# Patient Record
Sex: Female | Born: 1990 | Race: White | Hispanic: No | Marital: Single | Smoking: Never smoker
Health system: Southern US, Community
[De-identification: ages and names within clinical notes are randomized; demographics above are authoritative.]

---

## 2020-02-19 ENCOUNTER — Encounter (HOSPITAL_BASED_OUTPATIENT_CLINIC_OR_DEPARTMENT_OTHER): Payer: Self-pay

## 2020-02-19 ENCOUNTER — Emergency Department (HOSPITAL_BASED_OUTPATIENT_CLINIC_OR_DEPARTMENT_OTHER): Payer: Self-pay

## 2020-02-19 ENCOUNTER — Other Ambulatory Visit: Payer: Self-pay

## 2020-02-19 ENCOUNTER — Emergency Department (HOSPITAL_BASED_OUTPATIENT_CLINIC_OR_DEPARTMENT_OTHER)
Admission: EM | Admit: 2020-02-19 | Discharge: 2020-02-19 | Disposition: A | Discharge status: Home / Self Care | Payer: Self-pay | Attending: Emergency Medicine | Admitting: Emergency Medicine

## 2020-02-19 DIAGNOSIS — X509XXA Other and unspecified overexertion or strenuous movements or postures, initial encounter: Secondary | ICD-10-CM | POA: Insufficient documentation

## 2020-02-19 DIAGNOSIS — Y9389 Activity, other specified: Secondary | ICD-10-CM | POA: Insufficient documentation

## 2020-02-19 DIAGNOSIS — M79672 Pain in left foot: Secondary | ICD-10-CM

## 2020-02-19 DIAGNOSIS — Y999 Unspecified external cause status: Secondary | ICD-10-CM | POA: Insufficient documentation

## 2020-02-19 DIAGNOSIS — M25572 Pain in left ankle and joints of left foot: Secondary | ICD-10-CM

## 2020-02-19 DIAGNOSIS — Y9289 Other specified places as the place of occurrence of the external cause: Secondary | ICD-10-CM | POA: Insufficient documentation

## 2020-02-19 DIAGNOSIS — S9002XA Contusion of left ankle, initial encounter: Secondary | ICD-10-CM | POA: Insufficient documentation

## 2020-02-19 NOTE — ED Provider Notes (Signed)
[facility] EMERGENCY DEPARTMENT Provider Note   CSN: [id number] Arrival date & time: 02/19/20  2016     History Chief Complaint  Patient presents with   Foot Injury    Deborah Acevedo is a 29 y.o. female with past medical history who presents for evaluation of left ankle and foot pain.  Patient states she was on the slide with her child when she inverted her left ankle and foot.  Was initially able to walk on it however has had increased pain.  She has not take anything for pain.  She rates her pain a 7/10.  She is not on anything for pain at this time.  She is noted some swelling some ecchymosis to her lateral malleolus and dorsal aspect of her left foot.  No any headache, nausea or any coagulation.  No fever, chills, nausea, vomiting, chest pain, shortness of breath, redness, warmth, paresthesias to extremities.  States she does have some decreased range of motion to her foot and ankle secondary to pain.  Denies aggravating or relieving factors.  History obtained from patient and past medical records.  No interpreter is used.  HPI     No past medical history on file.  There are no problems to display for this patient.   History reviewed. No pertinent surgical history.   OB History   No obstetric history on file.     No family history on file.  Social History   Tobacco Use   Smoking status: Never Smoker   Smokeless tobacco: Never Used  Vaping Use   Vaping Use: Never used  Substance Use Topics   Alcohol use: Never   Drug use: Never    Home Medications Prior to Admission medications   Not on File    Allergies    Penicillin g  Review of Systems   Review of Systems  Constitutional: Negative.   HENT: Negative.   Respiratory: Negative.   Cardiovascular: Negative.   Gastrointestinal: Negative.   Genitourinary: Negative.   Musculoskeletal: Positive for gait problem.       Foot and ankle pain  Skin: Negative.   All other systems reviewed and  are negative.   Physical Exam Updated Vital Signs BP 115/85 (BP Location: Left Arm)    Pulse (!) 103    Temp 98.7 F (37.1 C) (Oral)    Resp 20    Ht 5\' 4"  (1.626 m)    Wt 90.7 kg    SpO2 100%    BMI 34.33 kg/m   Physical Exam Vitals and nursing note reviewed.  Constitutional:      General: She is not in acute distress.    Appearance: She is well-developed. She is not ill-appearing, toxic-appearing or diaphoretic.  HENT:     Head: Normocephalic and atraumatic.     Mouth/Throat:     Mouth: Mucous membranes are moist.  Eyes:     Pupils: Pupils are equal, round, and reactive to light.  Cardiovascular:     Rate and Rhythm: Normal rate.     Pulses: Normal pulses.     Heart sounds: Normal heart sounds.  Pulmonary:     Effort: Pulmonary effort is normal. No respiratory distress.     Breath sounds: Normal breath sounds.  Abdominal:     General: Bowel sounds are normal. There is no distension.  Musculoskeletal:        General: Normal range of motion.     Cervical back: Normal range of motion.  Comments: Tenderness palpation to lateral medial malleolus to left foot as well as over ATFL and to dorsal aspect of foot.  Wiggles toes without difficulty.  Is able to minimally plantarflex and dorsiflex however has pain.  No bony tenderness to midshaft, proximal tibia or fibula.  No tenderness over calcaneus.  Skin:    General: Skin is warm and dry.     Capillary Refill: Capillary refill takes less than 2 seconds.     Comments: Diffuse ecchymosis to left lateral malleolus, ATFL dorsal aspect of foot.  No erythema or warmth.  No fluctuance induration.  Neurological:     Mental Status: She is alert.     Sensory: Sensation is intact.     Motor: Motor function is intact.     Comments: Ambulatory with limp.  Decreased strength with plantar flexion to left foot secondary to pain.  Intact sensation.    ED Results / Procedures / Treatments   Labs (all labs ordered are listed, but only abnormal  results are displayed) Labs Reviewed - No data to display  EKG None  Radiology DG Ankle Complete Left  Result Date: 02/19/2020 CLINICAL DATA:  Status post trauma. EXAM: LEFT ANKLE COMPLETE - 3+ VIEW COMPARISON:  None. FINDINGS: There is no evidence of fracture, dislocation, or joint effusion. There is no evidence of arthropathy or other focal bone abnormality. Soft tissues are unremarkable. IMPRESSION: Negative. Electronically Signed   By: Virgina Norfolk M.D.   On: 02/19/2020 20:48   DG Foot Complete Left  Result Date: 02/19/2020 CLINICAL DATA:  Status post trauma. EXAM: LEFT FOOT - COMPLETE 3+ VIEW COMPARISON:  None. FINDINGS: There is no evidence of fracture or dislocation. There is no evidence of arthropathy or other focal bone abnormality. Mild-to-moderate severity focal soft tissue swelling is seen along the lateral aspect of the mid left foot. This is along the proximal portion of the fifth left metatarsal. Mild dorsal soft tissue swelling is also seen. IMPRESSION: Mild to moderate severity focal soft tissue swelling without evidence of an acute fracture. Electronically Signed   By: Virgina Norfolk M.D.   On: 02/19/2020 20:48    Procedures .Splint Application  Date/Time: 02/19/2020 9:44 PM Performed by: Nettie Elm, PA-C Authorized by: Nettie Elm, PA-C   Consent:    Consent obtained:  Verbal   Consent given by:  Patient   Risks discussed:  Discoloration, numbness, pain and swelling   Alternatives discussed:  Referral, observation, alternative treatment, delayed treatment and no treatment Pre-procedure details:    Sensation:  Normal Procedure details:    Laterality:  Left   Location:  Ankle   Ankle:  L ankle   Strapping: no     Cast type:  Short leg   Splint type:  Short leg Post-procedure details:    Pain:  Improved   Sensation:  Normal   Patient tolerance of procedure:  Tolerated well, no immediate complications   (including critical care  time)  Medications Ordered in ED Medications - No data to display  ED Course  I have reviewed the triage vital signs and the nursing notes.  Pertinent labs & imaging results that were available during my care of the patient were reviewed by me and considered in my medical decision making (see chart for details).  29 year old female presents for evaluation of left ankle and foot pain after inverting this at playground with child.  Afebrile, nonseptic, non-ill-appearing.  Patient with diffuse soft tissue swelling to left lateral malleolus as well  as over ATFL and dorsal aspect of foot.  No erythema or warmth.  Is able to flex and extend however has pain.  Ambulatory however with pain.  Neurovascularly intact.  Compartments soft.  Plain film x-ray does not show evidence of fracture or dislocation.  Suspect sprain or strain.  Will place in splint, crutches.  Low suspicion for septic joint, gout, hemarthrosis, myositis, vascular occlusion, bacterial infectious process.  RICE for symptomatic management.  Patient may follow-up outpatient with orthopedics if symptoms do not resolve. She declined pain medication here in ED.  The patient has been appropriately medically screened and/or stabilized in the ED. I have low suspicion for any other emergent medical condition which would require further screening, evaluation or treatment in the ED or require inpatient management.  Patient is hemodynamically stable and in no acute distress.  Patient able to ambulate in department prior to ED.  Evaluation does not show acute pathology that would require ongoing or additional emergent interventions while in the emergency department or further inpatient treatment.  I have discussed the diagnosis with the patient and answered all questions.  Pain is been managed while in the emergency department and patient has no further complaints prior to discharge.  Patient is comfortable with plan discussed in room and is stable for  discharge at this time.  I have discussed strict return precautions for returning to the emergency department.  Patient was encouraged to follow-up with PCP/specialist refer to at discharge.    MDM Rules/Calculators/A&P                           Final Clinical Impression(s) / ED Diagnoses Final diagnoses:  Acute left ankle pain  Left foot pain    Rx / DC Orders ED Discharge Orders    None       Michaell Grider A, PA-C 02/19/20 2147    Pricilla Loveless, MD 02/22/20 1542

## 2020-02-19 NOTE — Discharge Instructions (Signed)
Sure to ice and elevate your foot and ankle.  Use the crutches over the next 3 days.  If you are not able to ambulate after 3 days follow-up with orthopedics.  You may take Tylenol ibuprofen as needed for pain.  Do not take ibuprofen if you think you may be pregnant.

## 2020-02-19 NOTE — ED Triage Notes (Signed)
Pt presents with L ankle and foot pain after injuring it on a playground slide.

## 2020-11-04 IMAGING — CR DG ANKLE COMPLETE 3+V*L*
3 series · 3 of 3 positions shown · non-contrast
Comparison: None.

CLINICAL DATA: Status post trauma.

EXAM:
LEFT ANKLE COMPLETE - 3+ VIEW

[t ankle joint lat left]
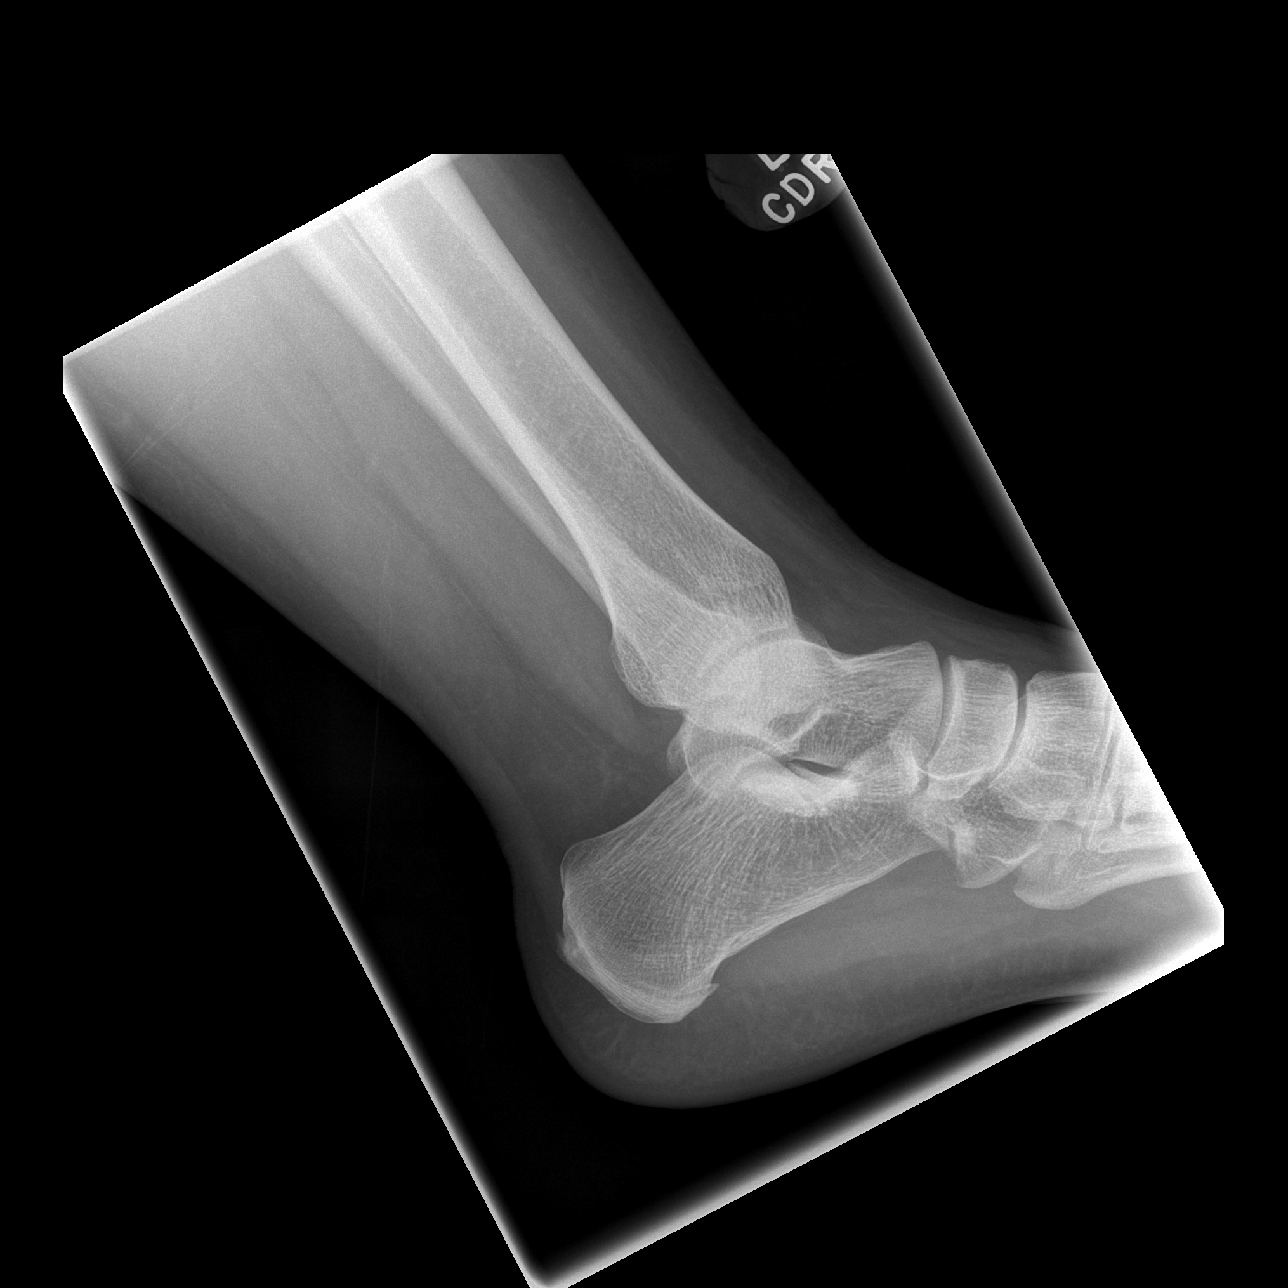

[t ankle joint ap left]
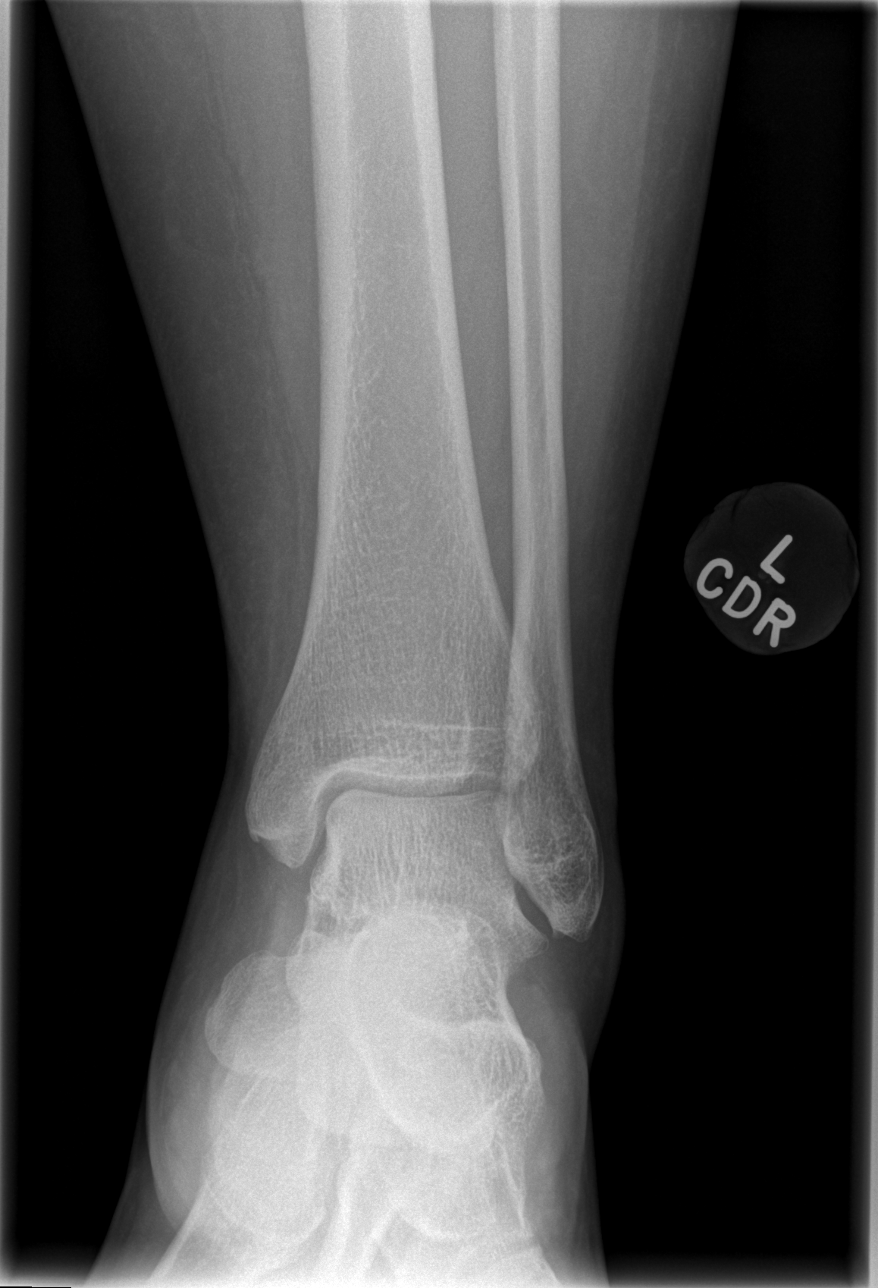

[t ankle joint oblique left]
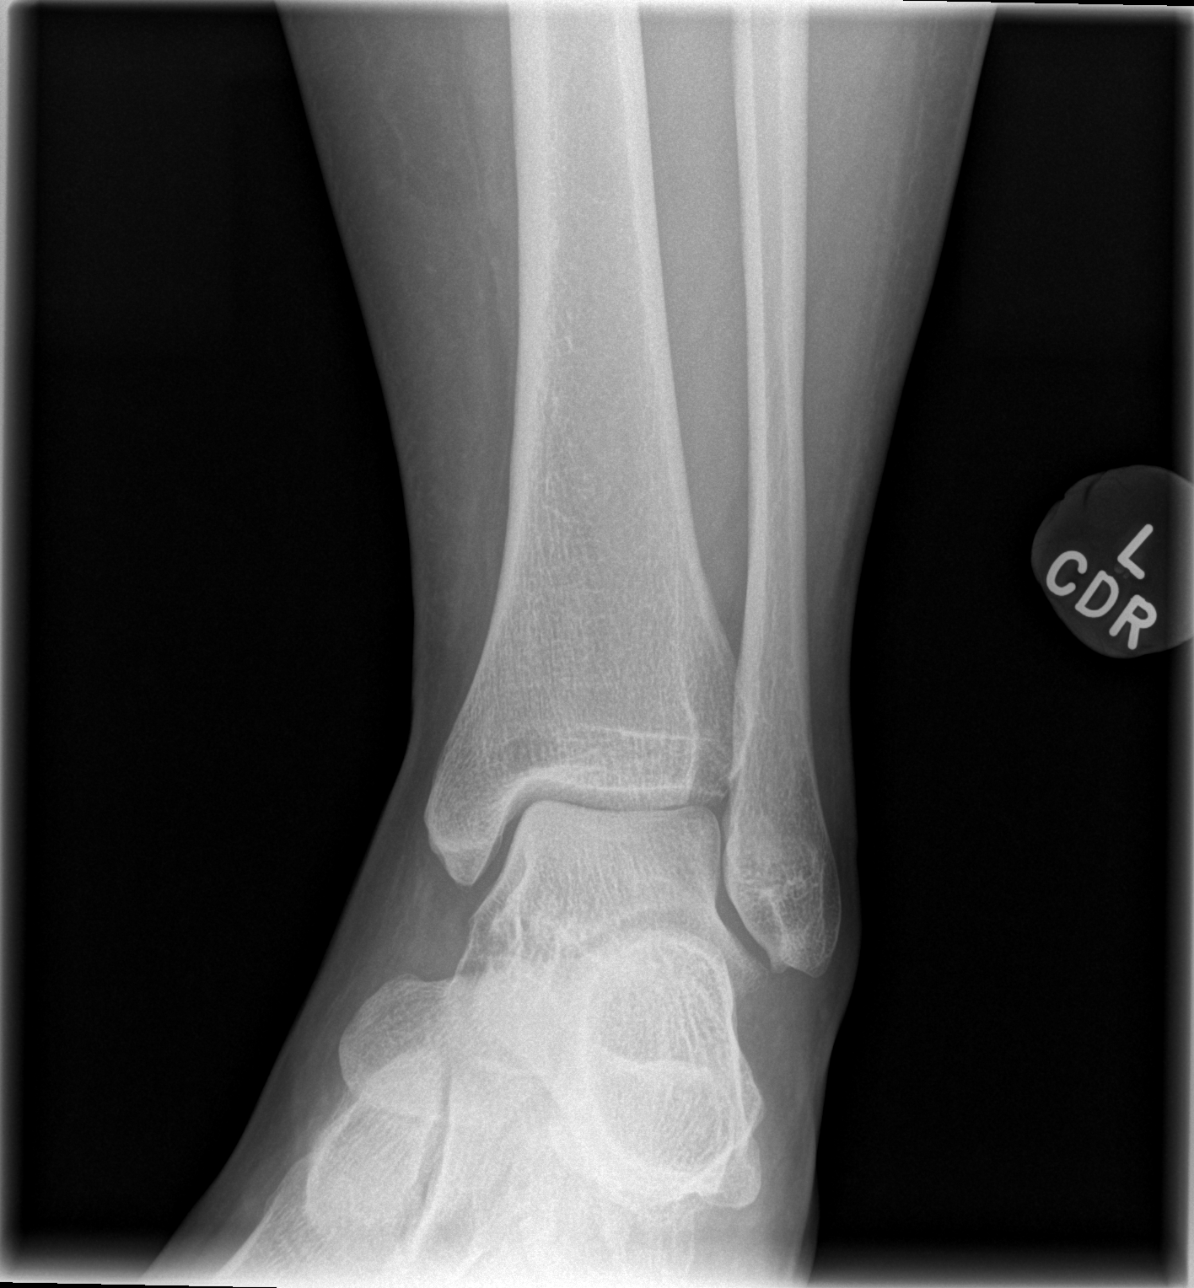

[3 of 3 positions shown; findings below may reference images not displayed]

FINDINGS: There is no evidence of fracture, dislocation, or joint effusion.
There is no evidence of arthropathy or other focal bone abnormality.
Soft tissues are unremarkable.
IMPRESSION: Negative.

## 2020-11-04 IMAGING — CR DG FOOT COMPLETE 3+V*L*
3 series · 3 of 3 positions shown · non-contrast
Comparison: None.

CLINICAL DATA: Status post trauma.

EXAM:
LEFT FOOT - COMPLETE 3+ VIEW

[t foot ap left]
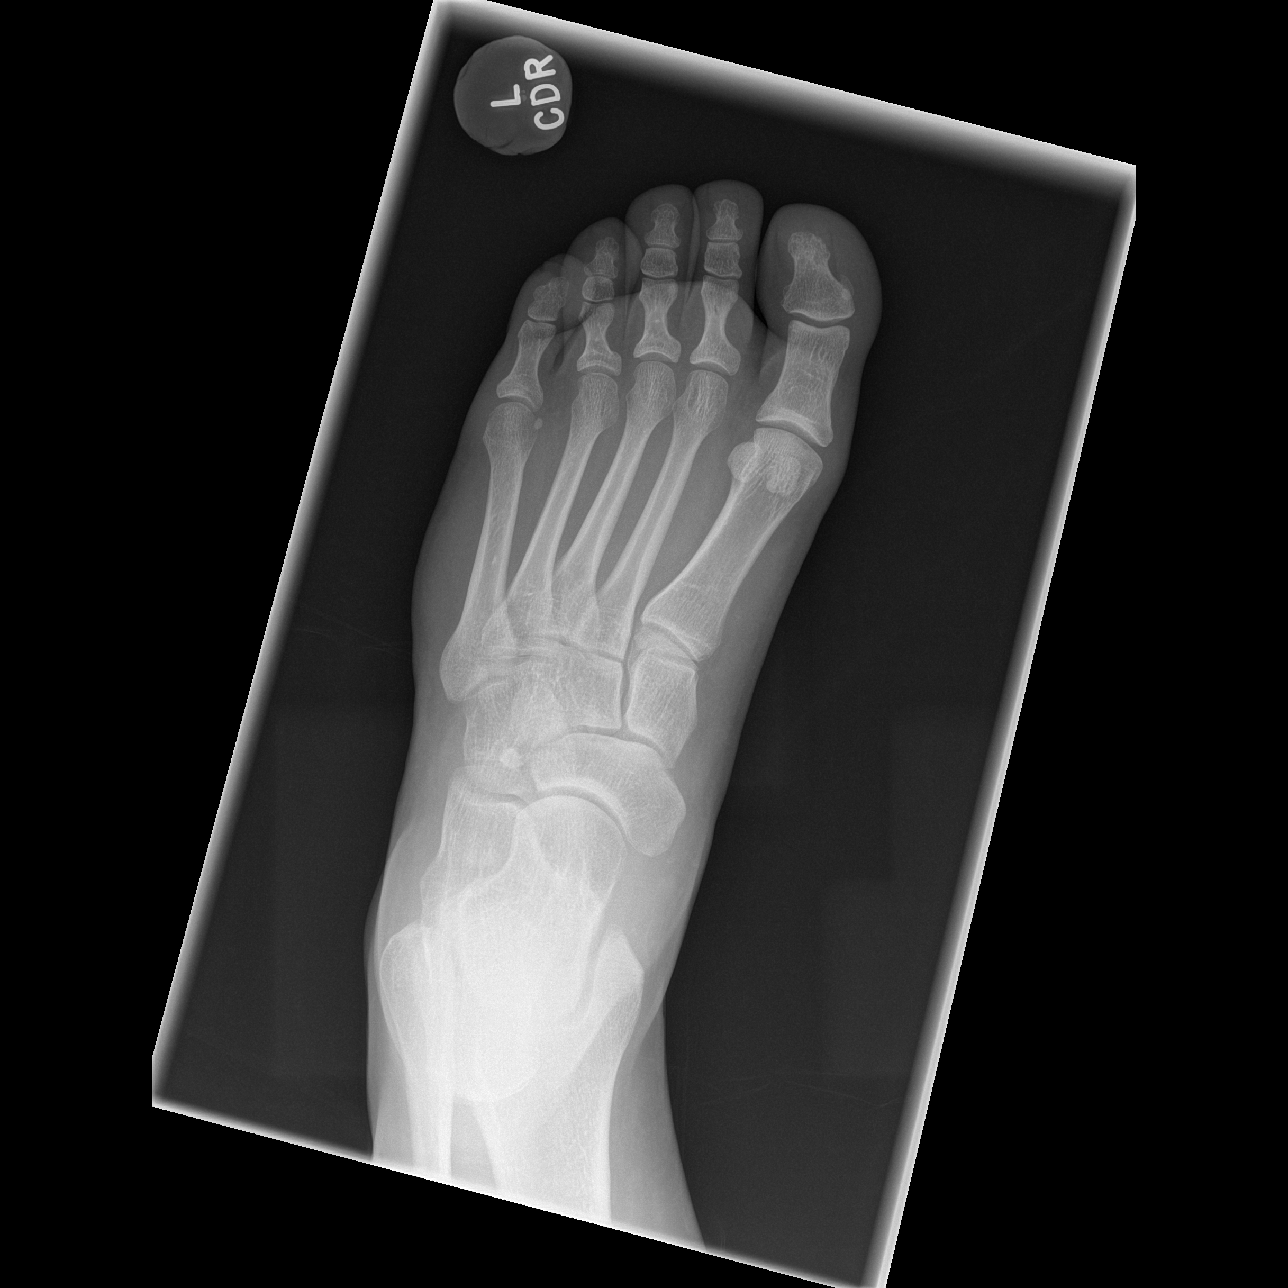

[t foot oblique left]
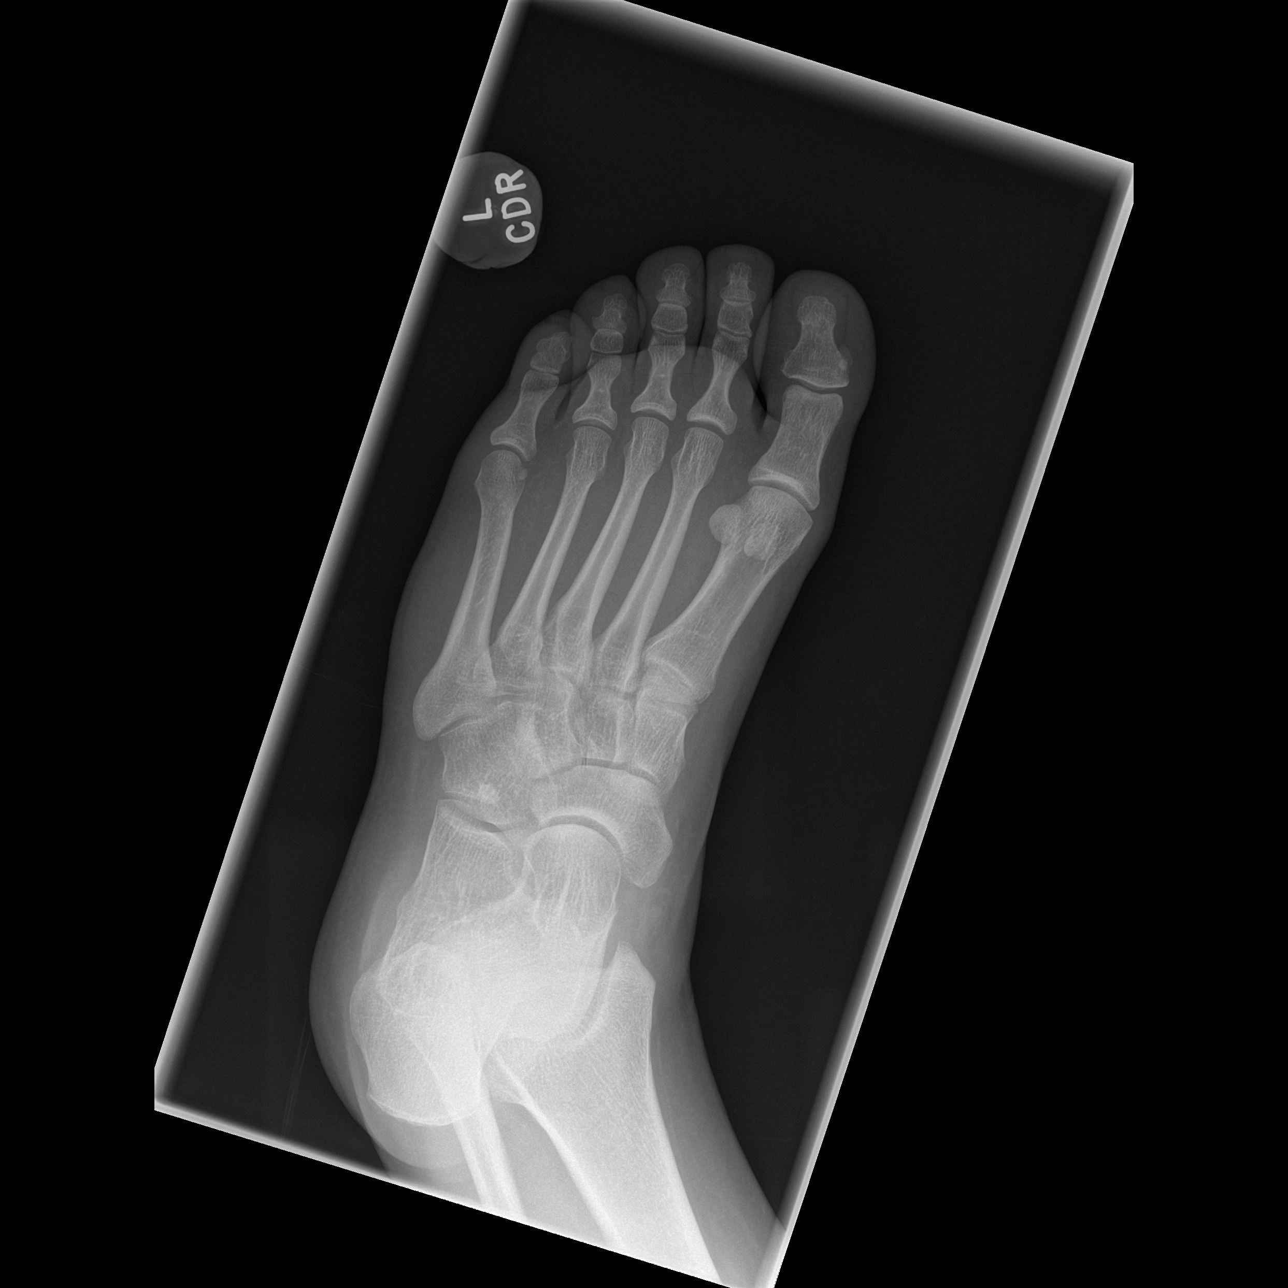

[t foot lat left]
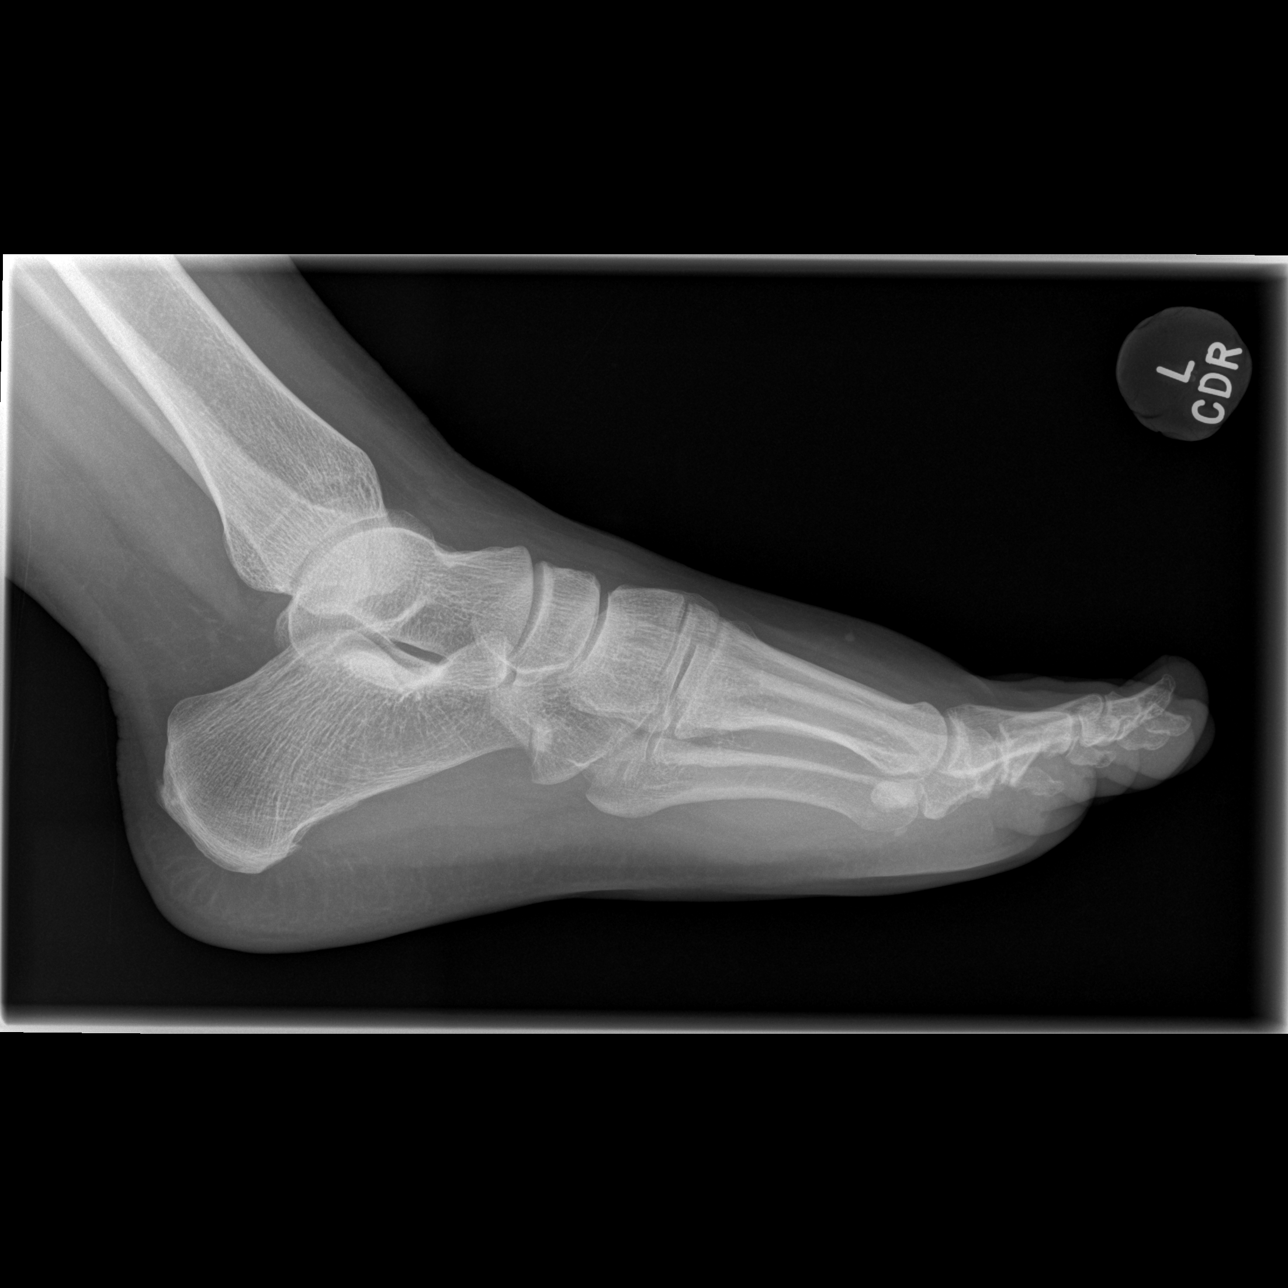

[3 of 3 positions shown; findings below may reference images not displayed]

FINDINGS: There is no evidence of fracture or dislocation. There is no
evidence of arthropathy or other focal bone abnormality.
Mild-to-moderate severity focal soft tissue swelling is seen along
the lateral aspect of the mid left foot. This is along the proximal
portion of the fifth left metatarsal. Mild dorsal soft tissue
swelling is also seen.
IMPRESSION: Mild to moderate severity focal soft tissue swelling without
evidence of an acute fracture.

## 2022-08-27 ENCOUNTER — Emergency Department (HOSPITAL_BASED_OUTPATIENT_CLINIC_OR_DEPARTMENT_OTHER): Payer: BC Managed Care – PPO

## 2022-08-27 ENCOUNTER — Emergency Department (HOSPITAL_BASED_OUTPATIENT_CLINIC_OR_DEPARTMENT_OTHER)
Admission: EM | Admit: 2022-08-27 | Discharge: 2022-08-27 | Disposition: A | Discharge status: Home / Self Care | Payer: BC Managed Care – PPO | Attending: Emergency Medicine | Admitting: Emergency Medicine

## 2022-08-27 ENCOUNTER — Other Ambulatory Visit: Payer: Self-pay

## 2022-08-27 DIAGNOSIS — Z20822 Contact with and (suspected) exposure to covid-19: Secondary | ICD-10-CM | POA: Insufficient documentation

## 2022-08-27 DIAGNOSIS — J189 Pneumonia, unspecified organism: Secondary | ICD-10-CM | POA: Insufficient documentation

## 2022-08-27 DIAGNOSIS — J101 Influenza due to other identified influenza virus with other respiratory manifestations: Secondary | ICD-10-CM | POA: Insufficient documentation

## 2022-08-27 DIAGNOSIS — J069 Acute upper respiratory infection, unspecified: Secondary | ICD-10-CM

## 2022-08-27 DIAGNOSIS — R0602 Shortness of breath: Secondary | ICD-10-CM | POA: Diagnosis present

## 2022-08-27 DIAGNOSIS — J45909 Unspecified asthma, uncomplicated: Secondary | ICD-10-CM | POA: Insufficient documentation

## 2022-08-27 MED ORDER — AZITHROMYCIN 250 MG PO TABS
500.0000 mg | ORAL_TABLET | Freq: Once | ORAL | Status: AC
  Administered 2022-08-27: 500 mg via ORAL
  Filled 2022-08-27: qty 2

## 2022-08-27 MED ORDER — AZITHROMYCIN 250 MG PO TABS: 250.0000 mg | ORAL_TABLET | Freq: Every day | ORAL | 0 refills | Status: AC

## 2022-08-27 MED ORDER — DEXAMETHASONE SODIUM PHOSPHATE 10 MG/ML IJ SOLN
10.0000 mg | Freq: Once | INTRAMUSCULAR | Status: AC
  Administered 2022-08-27: 10 mg via INTRAMUSCULAR
  Filled 2022-08-27: qty 1

## 2022-08-27 MED ORDER — ACETAMINOPHEN 325 MG PO TABS
650.0000 mg | ORAL_TABLET | Freq: Once | ORAL | Status: AC | PRN
  Administered 2022-08-27: 650 mg via ORAL
  Filled 2022-08-27: qty 2

## 2022-08-27 MED ORDER — ALBUTEROL SULFATE HFA 108 (90 BASE) MCG/ACT IN AERS
2.0000 | INHALATION_SPRAY | Freq: Once | RESPIRATORY_TRACT | Status: AC
  Administered 2022-08-27: 2 via RESPIRATORY_TRACT
  Filled 2022-08-27: qty 6.7

## 2022-08-27 NOTE — ED Notes (Signed)
Pt ambulatory from triage 

## 2022-08-27 NOTE — ED Triage Notes (Signed)
Pt tested positive for RSV and Flu on 08/08/22 and completed a round of antibiotics. Pt states cough, fever, and SOB worse. Chest pain with coughing.

## 2022-08-27 NOTE — ED Provider Notes (Signed)
Emergency Department Provider Note   I have reviewed the triage vital signs and the nursing notes.   HISTORY  Chief Complaint Fever, Cough, and Shortness of Breath   HPI Deborah Acevedo is a 31 y.o. female who presents the emergency department for relation of continued cough, fever, shortness of breath.  She tested positive for RSV and flu earlier in the month.  She completed 10 days of doxycycline and was feeling much better at that time.  Dates in the last several days her cough, fever, shortness of breath have worsened.  She does have pain in her chest but only with deep coughing.  She notes that she does have a history of asthma and often gets bronchitis which seems to respond best to azithromycin, which she has not taken for this episode.  She also describes frequent findings of possible pneumonia on her chest x-ray but has been told in the past that scarring.   No past medical history on file.  Review of Systems  Constitutional: Positive fever/chills Eyes: No visual changes. ENT: Mild sore throat and congestion.  Cardiovascular: Denies chest pain.  Respiratory: Mild shortness of breath. Positive cough.  Gastrointestinal: No abdominal pain.  No nausea, no vomiting.  No diarrhea.  No constipation. Genitourinary: Negative for dysuria. Musculoskeletal: Negative for back pain. Skin: Negative for rash. Neurological: Negative for headaches.  ____________________________________________   PHYSICAL EXAM:  VITAL SIGNS: ED Triage Vitals  Enc Vitals Group     BP 08/27/22 1847 (!) 129/95     Pulse Rate 08/27/22 1847 (!) 128     Resp 08/27/22 1847 20     Temp 08/27/22 1847 (!) 101 F (38.3 C)     Temp Source 08/27/22 2240 Oral     SpO2 08/27/22 1847 97 %     Weight 08/27/22 1851 278 lb (126.1 kg)   Constitutional: Alert and oriented. Well appearing and in no acute distress. Eyes: Conjunctivae are normal.  Head: Atraumatic. Nose: No congestion/rhinnorhea. Mouth/Throat:  Mucous membranes are moist. Neck: No stridor.   Cardiovascular: Tachycardia. Good peripheral circulation. Grossly normal heart sounds.   Respiratory: Normal respiratory effort.  No retractions. Lungs CTAB. No wheezing.  Gastrointestinal: Soft and nontender. No distention.  Musculoskeletal: No lower extremity tenderness nor edema. No gross deformities of extremities. Neurologic:  Normal speech and language. No gross focal neurologic deficits are appreciated.  Skin:  Skin is warm, dry and intact. No rash noted.  ____________________________________________   LABS (all labs ordered are listed, but only abnormal results are displayed)  Labs Reviewed  RESP PANEL BY RT-PCR (RSV, FLU A&B, COVID)  RVPGX2 - Abnormal; Notable for the following components:      Result Value   Influenza A by PCR POSITIVE (*)    All other components within normal limits   ____________________________________________  RADIOLOGY  DG Chest 2 View  Result Date: 08/27/2022 CLINICAL DATA:  Shortness of breath, positive for RSV EXAM: CHEST - 2 VIEW COMPARISON:  None Available. FINDINGS: Cardiac size is within normal limits. Subtle increase in markings are seen in medial right lower lung field. There are no signs of alveolar pulmonary edema. There is no pleural effusion or pneumothorax. IMPRESSION: Subtle increase in markings in the medial right lower lung field may suggest early pneumonia. There is no pleural effusion. Electronically Signed   By: Ernie Avena M.D.   On: 08/27/2022 19:12    ____________________________________________   PROCEDURES  Procedure(s) performed:   Procedures  None  ____________________________________________  INITIAL IMPRESSION / ASSESSMENT AND PLAN / ED COURSE  Pertinent labs & imaging results that were available during my care of the patient were reviewed by me and considered in my medical decision making (see chart for details).   This patient is Presenting for  Evaluation of URI, which does require a range of treatment options, and is a complaint that involves a high risk of morbidity and mortality.  The Differential Diagnoses include COVID, Flu, RSV, CAP, PE, ACS, etc.  Critical Interventions-    Medications  acetaminophen (TYLENOL) tablet 650 mg (650 mg Oral Given 08/27/22 1853)  albuterol (VENTOLIN HFA) 108 (90 Base) MCG/ACT inhaler 2 puff (2 puffs Inhalation Given 08/27/22 2322)  azithromycin (ZITHROMAX) tablet 500 mg (500 mg Oral Given 08/27/22 2333)  dexamethasone (DECADRON) injection 10 mg (10 mg Intramuscular Given 08/27/22 2331)    Reassessment after intervention: Symptoms improved.    I decided to review pertinent External Data, and in summary unable to find UC records or recent chest imaging for comparison.   Clinical Laboratory Tests Ordered, included flu a positive.  COVID and RSV negative.  Radiologic Tests Ordered, included CXR. I independently interpreted the images and agree with radiology interpretation.   Social Determinants of Health Risk no smoking.   Medical Decision Making: Summary:  Patient presents with fever and upper respiratory symptoms.  He testing positive for the flu here.  Chest x-ray shows questionable early pneumonia but per patient history this has also been described in prior chest x-rays as possible scarring.  I am unable to verify this to review prior chest x-rays so do plan to start azithromycin and give Decadron in the setting of known asthma as patient states this typically helps her symptoms.  Considered admission but no hypoxemia, increased WOB, or other acute findings.   Patient's presentation is most consistent with acute illness / injury with system symptoms.   Disposition: discharge  ____________________________________________  FINAL CLINICAL IMPRESSION(S) / ED DIAGNOSES  Final diagnoses:  Community acquired pneumonia of right lower lobe of lung  Upper respiratory tract infection,  unspecified type     NEW OUTPATIENT MEDICATIONS STARTED DURING THIS VISIT:  Discharge Medication List as of 08/27/2022 11:20 PM     START taking these medications   Details  azithromycin (ZITHROMAX) 250 MG tablet Take 1 tablet (250 mg total) by mouth daily. Take first 2 tablets together, then 1 every day until finished., Starting Wed 08/27/2022, Normal        Note:  This document was prepared using Dragon voice recognition software and may include unintentional dictation errors.  Alona Bene, MD, [facility] Emergency Medicine    Darnisha Vernet, Arlyss Repress, MD 08/29/22 [phone]

## 2022-08-27 NOTE — Discharge Instructions (Signed)

## 2022-08-27 NOTE — ED Notes (Signed)
D/c paperwork reviewed with pt, including f/u care and prescription. No questions or concerns at time of d/c. Ambulatory to ED exit with s/o.

## 2022-08-28 LAB — RESP PANEL BY RT-PCR (RSV, FLU A&B, COVID)  RVPGX2
Influenza A by PCR: POSITIVE — AB
Influenza B by PCR: NEGATIVE
Resp Syncytial Virus by PCR: NEGATIVE
SARS Coronavirus 2 by RT PCR: NEGATIVE
